# Patient Record
Sex: Male | Born: 2000 | Race: Black or African American | Hispanic: No | Marital: Single | Smoking: Never smoker
Health system: Southern US, Community
[De-identification: ages and names within clinical notes are randomized; demographics above are authoritative.]

## PROBLEM LIST (undated history)

## (undated) ENCOUNTER — Ambulatory Visit (HOSPITAL_COMMUNITY): Admission: EM | Payer: Medicaid Other

## (undated) DIAGNOSIS — J45909 Unspecified asthma, uncomplicated: Secondary | ICD-10-CM

---
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
## 2020-10-16 ENCOUNTER — Emergency Department (HOSPITAL_COMMUNITY)
Admission: EM | Admit: 2020-10-16 | Discharge: 2020-10-16 | Disposition: A | Discharge status: Home / Self Care | Payer: Medicaid Other | Attending: Emergency Medicine | Admitting: Emergency Medicine

## 2020-10-16 ENCOUNTER — Encounter (HOSPITAL_COMMUNITY): Payer: Self-pay | Admitting: Emergency Medicine

## 2020-10-16 DIAGNOSIS — R591 Generalized enlarged lymph nodes: Secondary | ICD-10-CM | POA: Insufficient documentation

## 2020-10-16 DIAGNOSIS — R109 Unspecified abdominal pain: Secondary | ICD-10-CM | POA: Diagnosis not present

## 2020-10-16 DIAGNOSIS — R590 Localized enlarged lymph nodes: Secondary | ICD-10-CM

## 2020-10-16 DIAGNOSIS — R1031 Right lower quadrant pain: Secondary | ICD-10-CM

## 2020-10-16 LAB — URINALYSIS, ROUTINE W REFLEX MICROSCOPIC
Bilirubin Urine: NEGATIVE
Glucose, UA: NEGATIVE mg/dL
Hgb urine dipstick: NEGATIVE
Ketones, ur: NEGATIVE mg/dL
Leukocytes,Ua: NEGATIVE
Nitrite: NEGATIVE
Protein, ur: NEGATIVE mg/dL
Specific Gravity, Urine: 1.027 (ref 1.005–1.030)
pH: 6 (ref 5.0–8.0)

## 2020-10-16 LAB — CBC WITH DIFFERENTIAL/PLATELET
Abs Immature Granulocytes: 0 10*3/uL (ref 0.00–0.07)
Basophils Absolute: 0 10*3/uL (ref 0.0–0.1)
Basophils Relative: 1 %
Eosinophils Absolute: 0.1 10*3/uL (ref 0.0–0.5)
Eosinophils Relative: 1 %
HCT: 43.3 % (ref 39.0–52.0)
Hemoglobin: 14.4 g/dL (ref 13.0–17.0)
Immature Granulocytes: 0 %
Lymphocytes Relative: 24 %
Lymphs Abs: 1.2 10*3/uL (ref 0.7–4.0)
MCH: 30.9 pg (ref 26.0–34.0)
MCHC: 33.3 g/dL (ref 30.0–36.0)
MCV: 92.9 fL (ref 80.0–100.0)
Monocytes Absolute: 0.6 10*3/uL (ref 0.1–1.0)
Monocytes Relative: 13 %
Neutro Abs: 3 10*3/uL (ref 1.7–7.7)
Neutrophils Relative %: 61 %
Platelets: 220 10*3/uL (ref 150–400)
RBC: 4.66 MIL/uL (ref 4.22–5.81)
RDW: 12.8 % (ref 11.5–15.5)
WBC: 4.9 10*3/uL (ref 4.0–10.5)
nRBC: 0 % (ref 0.0–0.2)

## 2020-10-16 LAB — BASIC METABOLIC PANEL
Anion gap: 9 (ref 5–15)
BUN: 8 mg/dL (ref 6–20)
CO2: 22 mmol/L (ref 22–32)
Calcium: 9.1 mg/dL (ref 8.9–10.3)
Chloride: 105 mmol/L (ref 98–111)
Creatinine, Ser: 0.78 mg/dL (ref 0.61–1.24)
GFR, Estimated: >60 mL/min (ref >60)
Glucose, Bld: 85 mg/dL (ref 70–99)
Potassium: 4.1 mmol/L (ref 3.5–5.1)
Sodium: 136 mmol/L (ref 135–145)

## 2020-10-16 NOTE — ED Triage Notes (Signed)
Pt here with reports of bulge above R groin onset approx 2 days ago. Denies pain.

## 2020-10-16 NOTE — ED Provider Notes (Signed)
[facility] EMERGENCY DEPARTMENT Provider Note   CSN: [id number] Arrival date & time: 10/16/20  1014     History No chief complaint on file.   Brian Evans is a 20 y.o. male.  He is here with complaint of some tender nodules in his right groin that been going on for 2 days.  No known trauma no infection.  No urinary symptoms.  Denies being sexually active and no prior history of STDs.  Worse with palpation or bending.  No nausea vomiting.  The history is provided by the patient.  Groin Pain This is a new problem. The current episode started 2 days ago. The problem occurs constantly. The problem has not changed since onset.Pertinent negatives include no chest pain, no abdominal pain, no headaches and no shortness of breath. The symptoms are aggravated by bending. Nothing relieves the symptoms. He has tried nothing for the symptoms. The treatment provided no relief.       No past medical history on file.  There are no problems to display for this patient.   History reviewed. No pertinent surgical history.     No family history on file.     Home Medications Prior to Admission medications   Not on File    Allergies    Patient has no known allergies.  Review of Systems   Review of Systems  Constitutional: Negative for fever.  HENT: Negative for sore throat.   Eyes: Negative for visual disturbance.  Respiratory: Negative for shortness of breath.   Cardiovascular: Negative for chest pain.  Gastrointestinal: Negative for abdominal pain, nausea and vomiting.  Genitourinary: Negative for dysuria, genital sores, hematuria, penile discharge, scrotal swelling and testicular pain.  Musculoskeletal: Negative for back pain.  Skin: Negative for rash and wound.  Neurological: Negative for headaches.    Physical Exam Updated Vital Signs BP 133/81 (BP Location: Left Arm)   Pulse 91   Temp 98.3 F (36.8 C)   Resp 16   SpO2 100%   Physical Exam Vitals  and nursing note reviewed.  Constitutional:      Appearance: Normal appearance. He is well-developed and well-nourished.  HENT:     Head: Normocephalic and atraumatic.  Eyes:     Conjunctiva/sclera: Conjunctivae normal.  Cardiovascular:     Rate and Rhythm: Normal rate and regular rhythm.     Heart sounds: No murmur heard.   Pulmonary:     Effort: Pulmonary effort is normal. No respiratory distress.     Breath sounds: Normal breath sounds.  Abdominal:     Palpations: Abdomen is soft.     Tenderness: There is no abdominal tenderness. There is no guarding or rebound.  Genitourinary:    Penis: Normal.      Testes: Normal.     Comments: No inguinal hernia appreciated.  No testicular pain or swelling.  He does have some small approximately 1 cm tender lymph nodes in his right inguinal chain.  No overlying skin findings. Musculoskeletal:        General: No edema.     Cervical back: Neck supple.  Skin:    General: Skin is warm and dry.  Neurological:     Mental Status: He is alert.  Psychiatric:        Mood and Affect: Mood and affect normal.     ED Results / Procedures / Treatments   Labs (all labs ordered are listed, but only abnormal results are displayed) Labs Reviewed  BASIC METABOLIC PANEL  CBC  WITH DIFFERENTIAL/PLATELET  URINALYSIS, ROUTINE W REFLEX MICROSCOPIC  RPR  GC/CHLAMYDIA PROBE AMP ([place]) NOT AT [facility]    EKG None  Radiology No results found.  Procedures Ultrasound ED Soft Tissue  Date/Time: 10/16/2020 12:03 PM Performed by: Terrilee Files, MD Authorized by: Terrilee Files, MD   Procedure details:    Indications: limb pain     Transverse view:  Visualized   Longitudinal view:  Visualized   Images: archived   Location:    Location: groin     Side:  Right Findings:     no abscess present    no cellulitis present    no foreign body present     Medications Ordered in ED Medications - No data to display  ED Course  I have  reviewed the triage vital signs and the nursing notes.  Pertinent labs & imaging results that were available during my care of the patient were reviewed by me and considered in my medical decision making (see chart for details).    MDM Rules/Calculators/A&P                         This patient complains of lump in right groin; this involves an extensive number of treatment Options and is a complaint that carries with it a high risk of complications and Morbidity. The differential includes hernia, lymphadenopathy, STD, lymphoma, contusion  I ordered, reviewed and interpreted labs, which included CBC with normal white count normal hemoglobin chemistries normal, urinalysis negative, GC chlamydia pending at time of discharge, RPR pending I ordered imaging studies which included bedside soft tissue ultrasound and I independently    visualized and interpreted imaging which showed no obvious abscess is identified Additional history obtained from patient's mother Previous records obtained and reviewed in epic, no recent admissions  After the interventions stated above, I reevaluated the patient and found patient to be minimally symptomatic.  Has what appears to be tender inguinal lymphadenopathy not particularly enlarged.  Recommended symptomatic treatment with ibuprofen heat.  Clear return instructions if does not resolve within a week or 2 or if enlarges.   Final Clinical Impression(s) / ED Diagnoses Final diagnoses:  Right groin pain  Lymphadenopathy, inguinal    Rx / DC Orders ED Discharge Orders    None       Terrilee Files, MD 10/16/20 1859

## 2020-10-16 NOTE — Discharge Instructions (Signed)
You were seen in the emergency department for right groin pain and some tender bumps in the groin.  These are likely lymph nodes and usually will resolve on their own.  You can use ibuprofen and apply a warm compress.  If they do not resolve in a week or 2 or if they are growing in size please return to the emergency department for reevaluation.

## 2020-10-17 LAB — RPR: RPR Ser Ql: NONREACTIVE

## 2020-10-19 LAB — GC/CHLAMYDIA PROBE AMP (~~LOC~~) NOT AT ARMC
Chlamydia: NEGATIVE
Comment: NEGATIVE
Comment: NORMAL
Neisseria Gonorrhea: NEGATIVE

## 2022-02-20 ENCOUNTER — Other Ambulatory Visit: Payer: Self-pay

## 2022-02-20 ENCOUNTER — Encounter (HOSPITAL_COMMUNITY): Payer: Self-pay | Admitting: Emergency Medicine

## 2022-02-20 ENCOUNTER — Emergency Department (HOSPITAL_COMMUNITY)
Admission: EM | Admit: 2022-02-20 | Discharge: 2022-02-20 | Discharge status: Against Medical Advice | Payer: Medicaid Other | Attending: Emergency Medicine | Admitting: Emergency Medicine

## 2022-02-20 DIAGNOSIS — Z113 Encounter for screening for infections with a predominantly sexual mode of transmission: Secondary | ICD-10-CM | POA: Insufficient documentation

## 2022-02-20 DIAGNOSIS — R300 Dysuria: Secondary | ICD-10-CM | POA: Diagnosis not present

## 2022-02-20 DIAGNOSIS — Z5321 Procedure and treatment not carried out due to patient leaving prior to being seen by health care provider: Secondary | ICD-10-CM | POA: Insufficient documentation

## 2022-02-20 LAB — URINALYSIS, ROUTINE W REFLEX MICROSCOPIC
Bilirubin Urine: NEGATIVE
Glucose, UA: NEGATIVE mg/dL
Hgb urine dipstick: NEGATIVE
Ketones, ur: NEGATIVE mg/dL
Leukocytes,Ua: NEGATIVE
Nitrite: NEGATIVE
Protein, ur: NEGATIVE mg/dL
Specific Gravity, Urine: 1.024 (ref 1.005–1.030)
pH: 5 (ref 5.0–8.0)

## 2022-02-20 NOTE — ED Notes (Signed)
PT leaving at this time 

## 2022-02-20 NOTE — ED Triage Notes (Signed)
Patient requesting STD screening reports dysuria onset last week , denies penile discharge.

## 2022-03-14 ENCOUNTER — Emergency Department (HOSPITAL_COMMUNITY): Payer: Medicaid Other

## 2022-03-14 ENCOUNTER — Encounter (HOSPITAL_COMMUNITY): Payer: Self-pay

## 2022-03-14 ENCOUNTER — Emergency Department (HOSPITAL_COMMUNITY)
Admission: EM | Admit: 2022-03-14 | Discharge: 2022-03-14 | Disposition: A | Discharge status: Home / Self Care | Payer: Medicaid Other | Attending: Emergency Medicine | Admitting: Emergency Medicine

## 2022-03-14 DIAGNOSIS — R000 Tachycardia, unspecified: Secondary | ICD-10-CM | POA: Diagnosis not present

## 2022-03-14 DIAGNOSIS — S31819A Unspecified open wound of right buttock, initial encounter: Secondary | ICD-10-CM | POA: Diagnosis not present

## 2022-03-14 DIAGNOSIS — Z23 Encounter for immunization: Secondary | ICD-10-CM | POA: Insufficient documentation

## 2022-03-14 DIAGNOSIS — S31829A Unspecified open wound of left buttock, initial encounter: Secondary | ICD-10-CM | POA: Insufficient documentation

## 2022-03-14 DIAGNOSIS — W3400XA Accidental discharge from unspecified firearms or gun, initial encounter: Secondary | ICD-10-CM | POA: Diagnosis not present

## 2022-03-14 DIAGNOSIS — S31803A Puncture wound without foreign body of unspecified buttock, initial encounter: Secondary | ICD-10-CM

## 2022-03-14 LAB — URINALYSIS, ROUTINE W REFLEX MICROSCOPIC
Bilirubin Urine: NEGATIVE
Glucose, UA: NEGATIVE mg/dL
Hgb urine dipstick: NEGATIVE
Ketones, ur: NEGATIVE mg/dL
Leukocytes,Ua: NEGATIVE
Nitrite: NEGATIVE
Protein, ur: NEGATIVE mg/dL
Specific Gravity, Urine: 1.028 (ref 1.005–1.030)
pH: 7 (ref 5.0–8.0)

## 2022-03-14 LAB — I-STAT CHEM 8, ED
BUN: 4 mg/dL — ABNORMAL LOW (ref 6–20)
Calcium, Ion: 0.98 mmol/L — ABNORMAL LOW (ref 1.15–1.40)
Chloride: 101 mmol/L (ref 98–111)
Creatinine, Ser: 0.9 mg/dL (ref 0.61–1.24)
Glucose, Bld: 102 mg/dL — ABNORMAL HIGH (ref 70–99)
HCT: 49 % (ref 39.0–52.0)
Hemoglobin: 16.7 g/dL (ref 13.0–17.0)
Potassium: 3 mmol/L — ABNORMAL LOW (ref 3.5–5.1)
Sodium: 139 mmol/L (ref 135–145)
TCO2: 23 mmol/L (ref 22–32)

## 2022-03-14 LAB — CBC
HCT: 46 % (ref 39.0–52.0)
Hemoglobin: 15.9 g/dL (ref 13.0–17.0)
MCH: 32.1 pg (ref 26.0–34.0)
MCHC: 34.6 g/dL (ref 30.0–36.0)
MCV: 92.7 fL (ref 80.0–100.0)
Platelets: 264 10*3/uL (ref 150–400)
RBC: 4.96 MIL/uL (ref 4.22–5.81)
RDW: 12.7 % (ref 11.5–15.5)
WBC: 7.3 10*3/uL (ref 4.0–10.5)
nRBC: 0 % (ref 0.0–0.2)

## 2022-03-14 LAB — COMPREHENSIVE METABOLIC PANEL
ALT: 10 U/L (ref 0–44)
AST: 20 U/L (ref 15–41)
Albumin: 3.6 g/dL (ref 3.5–5.0)
Alkaline Phosphatase: 72 U/L (ref 38–126)
Anion gap: 8 (ref 5–15)
BUN: 5 mg/dL — ABNORMAL LOW (ref 6–20)
CO2: 24 mmol/L (ref 22–32)
Calcium: 8.5 mg/dL — ABNORMAL LOW (ref 8.9–10.3)
Chloride: 105 mmol/L (ref 98–111)
Creatinine, Ser: 1.11 mg/dL (ref 0.61–1.24)
GFR, Estimated: >60 mL/min (ref >60)
Glucose, Bld: 109 mg/dL — ABNORMAL HIGH (ref 70–99)
Potassium: 3.3 mmol/L — ABNORMAL LOW (ref 3.5–5.1)
Sodium: 137 mmol/L (ref 135–145)
Total Bilirubin: 0.7 mg/dL (ref 0.3–1.2)
Total Protein: 5.7 g/dL — ABNORMAL LOW (ref 6.5–8.1)

## 2022-03-14 LAB — PROTIME-INR
INR: 1.1 (ref 0.8–1.2)
Prothrombin Time: 13.7 seconds (ref 11.4–15.2)

## 2022-03-14 LAB — LACTIC ACID, PLASMA
Lactic Acid, Venous: 4 mmol/L (ref 0.5–1.9)
Lactic Acid, Venous: 1 mmol/L (ref 0.5–1.9)

## 2022-03-14 LAB — ETHANOL: Alcohol, Ethyl (B): <10 mg/dL (ref <10)

## 2022-03-14 MED ORDER — FENTANYL CITRATE PF 50 MCG/ML IJ SOSY
50.0000 ug | PREFILLED_SYRINGE | Freq: Once | INTRAMUSCULAR | Status: AC
  Administered 2022-03-14: 50 ug via INTRAVENOUS
  Filled 2022-03-14: qty 1

## 2022-03-14 MED ORDER — TETANUS-DIPHTH-ACELL PERTUSSIS 5-2.5-18.5 LF-MCG/0.5 IM SUSY
0.5000 mL | PREFILLED_SYRINGE | Freq: Once | INTRAMUSCULAR | Status: AC
  Administered 2022-03-14: 0.5 mL via INTRAMUSCULAR
  Filled 2022-03-14: qty 0.5

## 2022-03-14 MED ORDER — IOHEXOL 300 MG/ML  SOLN
100.0000 mL | Freq: Once | INTRAMUSCULAR | Status: AC | PRN
  Administered 2022-03-14: 100 mL via INTRAVENOUS

## 2022-03-14 MED ORDER — ONDANSETRON HCL 4 MG/2ML IJ SOLN
4.0000 mg | Freq: Once | INTRAMUSCULAR | Status: AC
  Administered 2022-03-14: 4 mg via INTRAVENOUS
  Filled 2022-03-14: qty 2

## 2022-03-14 MED ORDER — OXYCODONE-ACETAMINOPHEN 5-325 MG PO TABS: 1.0000 | ORAL_TABLET | Freq: Four times a day (QID) | ORAL | 0 refills | Status: DC | PRN

## 2022-03-14 MED ORDER — OXYCODONE-ACETAMINOPHEN 5-325 MG PO TABS
1.0000 | ORAL_TABLET | Freq: Once | ORAL | Status: AC
  Administered 2022-03-14: 1 via ORAL
  Filled 2022-03-14: qty 1

## 2022-03-14 MED ORDER — LACTATED RINGERS IV BOLUS
1000.0000 mL | Freq: Once | INTRAVENOUS | Status: AC
  Administered 2022-03-14: 1000 mL via INTRAVENOUS

## 2022-03-14 MED ORDER — KETOROLAC TROMETHAMINE 15 MG/ML IJ SOLN
15.0000 mg | Freq: Once | INTRAMUSCULAR | Status: AC
  Administered 2022-03-14: 15 mg via INTRAVENOUS
  Filled 2022-03-14: qty 1
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
## 2022-03-14 NOTE — ED Provider Notes (Signed)
7:25 PM Repeat lactic normal.  Patient awake, alert, standing upright, speaking on cellular telephone with no difficulty.   Gerhard Munch, MD 03/14/22 1925
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
## 2022-03-14 NOTE — ED Notes (Signed)
Pt left without receiving discharge instructions. Stated his ride was here. Ambulatory without difficulty.
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
## 2022-03-20 ENCOUNTER — Other Ambulatory Visit: Payer: Self-pay

## 2022-03-20 ENCOUNTER — Ambulatory Visit (HOSPITAL_COMMUNITY)
Admission: EM | Admit: 2022-03-20 | Discharge: 2022-03-20 | Disposition: A | Discharge status: Home / Self Care | Payer: Medicaid Other | Attending: Internal Medicine | Admitting: Internal Medicine

## 2022-03-20 ENCOUNTER — Encounter (HOSPITAL_COMMUNITY): Payer: Self-pay | Admitting: *Deleted

## 2022-03-20 DIAGNOSIS — W3400XD Accidental discharge from unspecified firearms or gun, subsequent encounter: Secondary | ICD-10-CM | POA: Diagnosis not present

## 2022-03-20 DIAGNOSIS — L089 Local infection of the skin and subcutaneous tissue, unspecified: Secondary | ICD-10-CM

## 2022-03-20 MED ORDER — IBUPROFEN 800 MG PO TABS: 800.0000 mg | ORAL_TABLET | Freq: Three times a day (TID) | ORAL | 0 refills | Status: DC

## 2022-03-20 MED ORDER — KETOROLAC TROMETHAMINE 30 MG/ML IJ SOLN
INTRAMUSCULAR | Status: AC
  Filled 2022-03-20: qty 1

## 2022-03-20 MED ORDER — KETOROLAC TROMETHAMINE 30 MG/ML IJ SOLN
30.0000 mg | Freq: Once | INTRAMUSCULAR | Status: AC
  Administered 2022-03-20: 30 mg via INTRAMUSCULAR

## 2022-03-20 MED ORDER — CEPHALEXIN 500 MG PO CAPS: 500.0000 mg | ORAL_CAPSULE | Freq: Three times a day (TID) | ORAL | 0 refills | Status: DC
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
## 2022-03-20 NOTE — ED Triage Notes (Addendum)
2
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
## 2022-03-20 NOTE — ED Notes (Signed)
INfo placed on wrong PT

## 2022-11-01 ENCOUNTER — Encounter (HOSPITAL_COMMUNITY): Payer: Self-pay | Admitting: Emergency Medicine

## 2022-11-01 ENCOUNTER — Emergency Department (HOSPITAL_COMMUNITY): Payer: Medicaid Other

## 2022-11-01 ENCOUNTER — Emergency Department (HOSPITAL_COMMUNITY)
Admission: EM | Admit: 2022-11-01 | Discharge: 2022-11-01 | Disposition: A | Discharge status: Home / Self Care | Payer: Medicaid Other | Attending: Emergency Medicine | Admitting: Emergency Medicine

## 2022-11-01 ENCOUNTER — Other Ambulatory Visit: Payer: Self-pay

## 2022-11-01 DIAGNOSIS — Y92410 Unspecified street and highway as the place of occurrence of the external cause: Secondary | ICD-10-CM | POA: Diagnosis not present

## 2022-11-01 DIAGNOSIS — M545 Low back pain, unspecified: Secondary | ICD-10-CM | POA: Insufficient documentation

## 2022-11-01 DIAGNOSIS — T148XXA Other injury of unspecified body region, initial encounter: Secondary | ICD-10-CM | POA: Insufficient documentation

## 2022-11-01 DIAGNOSIS — T07XXXA Unspecified multiple injuries, initial encounter: Secondary | ICD-10-CM

## 2022-11-01 DIAGNOSIS — M549 Dorsalgia, unspecified: Secondary | ICD-10-CM | POA: Diagnosis present

## 2022-11-01 HISTORY — DX: Unspecified asthma, uncomplicated: J45.909

## 2022-11-01 MED ORDER — NAPROXEN 500 MG PO TABS: 500.0000 mg | ORAL_TABLET | Freq: Two times a day (BID) | ORAL | 0 refills | Status: DC
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
## 2022-11-01 NOTE — Discharge Instructions (Signed)
Your CT scan is normal - no signs of bleeding or fractures Concussions may cause 2 weeks of headache, vomiting or other symptoms  Please take Naprosyn, 500mg  by mouth twice daily as needed for pain - this in an antiinflammatory medicine (NSAID) and is similar to ibuprofen - many people feel that it is stronger than ibuprofen and it is easier to take since it is a smaller pill.  Please use this only for 1 week - if your pain persists, you will need to follow up with your doctor in the office for ongoing guidance and pain control.  Please take Naprosyn, 500mg  by mouth twice daily as needed for pain - this in an antiinflammatory medicine (NSAID) and is similar to ibuprofen - many people feel that it is stronger than ibuprofen and it is easier to take since it is a smaller pill.  Please use this only for 1 week - if your pain persists, you will need to follow up with your doctor in the office for ongoing guidance and pain control.  Thank you for allowing [place] to treat you in the emergency department today.  After reviewing your examination and potential testing that was done it appears that you are safe to go home.  I would like for you to follow-up with your doctor within the next several days, have them obtain your results and follow-up with them to review all of these tests.  If you should develop severe or worsening symptoms return to the emergency department immediately
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
## 2023-01-20 ENCOUNTER — Ambulatory Visit (HOSPITAL_COMMUNITY)
Admission: EM | Admit: 2023-01-20 | Discharge: 2023-01-20 | Discharge status: Against Medical Advice | Payer: Medicaid Other

## 2023-01-20 NOTE — ED Notes (Signed)
Called patient x 2 days.

## 2023-01-22 ENCOUNTER — Encounter (HOSPITAL_COMMUNITY): Payer: Self-pay | Admitting: Emergency Medicine

## 2023-01-22 ENCOUNTER — Ambulatory Visit (HOSPITAL_COMMUNITY)
Admission: EM | Admit: 2023-01-22 | Discharge: 2023-01-22 | Disposition: A | Discharge status: Home / Self Care | Payer: Medicaid Other | Attending: Emergency Medicine | Admitting: Emergency Medicine

## 2023-01-22 DIAGNOSIS — Z113 Encounter for screening for infections with a predominantly sexual mode of transmission: Secondary | ICD-10-CM | POA: Diagnosis present

## 2023-01-22 LAB — HIV ANTIBODY (ROUTINE TESTING W REFLEX): HIV Screen 4th Generation wRfx: NONREACTIVE

## 2023-01-22 LAB — RPR: RPR Ser Ql: NONREACTIVE

## 2023-01-22 NOTE — Discharge Instructions (Addendum)
We have screened you for sexually transmitted infections today.  The results will take a few days, but we will contact you if anything results is abnormal.  Results will be available positive or negative on your MyChart.  Abstain from intercourse until all results are received, if you test positive, please notify any sexual partners.  Please follow-up with a primary care provider or return to clinic for any new or concerning symptoms.

## 2023-01-22 NOTE — ED Triage Notes (Signed)
Pt presents to UC for routine STD testing. Denies any symptoms at this time. 

## 2023-01-22 NOTE — ED Provider Notes (Signed)
[facility]    CSN: [id number] Arrival date & time: 01/22/23  1248      History   Chief Complaint Chief Complaint  Patient presents with   SEXUALLY TRANSMITTED DISEASE    HPI Brian Evans is a 22 y.o. male.   Patient presents to clinic for sexually-transmitted infection screening.  He denies any symptoms.  No dysuria, penile lesions or penile discharge.  Denies any exposures to STIs.  Would like HIV and syphilis screening.  The history is provided by the patient and medical records.    Past Medical History:  Diagnosis Date   Asthma     There are no problems to display for this patient.   History reviewed. No pertinent surgical history.     Home Medications    Prior to Admission medications   Medication Sig Start Date End Date Taking? Authorizing Provider  cephALEXin (KEFLEX) 500 MG capsule Take 1 capsule (500 mg total) by mouth 3 (three) times daily. 03/20/22   Carlisle Beers, FNP  ibuprofen (ADVIL) 800 MG tablet Take 1 tablet (800 mg total) by mouth 3 (three) times daily. 03/20/22   Carlisle Beers, FNP  naproxen (NAPROSYN) 500 MG tablet Take 1 tablet (500 mg total) by mouth 2 (two) times daily with a meal. 11/01/22   Eber Hong, MD  oxyCODONE-acetaminophen (PERCOCET/ROXICET) 5-325 MG tablet Take 1 tablet by mouth every 6 (six) hours as needed for severe pain. 03/14/22   Gwyneth Sprout, MD    Family History History reviewed. No pertinent family history.  Social History Social History   Tobacco Use   Smoking status: Never   Smokeless tobacco: Never  Substance Use Topics   Alcohol use: Never   Drug use: Never     Allergies   Patient has no known allergies.   Review of Systems Review of Systems  Genitourinary:  Negative for dysuria, flank pain, genital sores, penile discharge, penile pain, penile swelling, scrotal swelling and testicular pain.     Physical Exam Triage Vital Signs ED Triage Vitals [01/22/23 1301]  Enc  Vitals Group     BP (!) 142/82     Pulse Rate 77     Resp 18     Temp 98.1 F (36.7 C)     Temp Source Oral     SpO2 99 %     Weight      Height      Head Circumference      Peak Flow      Pain Score 0     Pain Loc      Pain Edu?      Excl. in GC?    No data found.  Updated Vital Signs BP (!) 142/82 (BP Location: Left Arm)   Pulse 77   Temp 98.1 F (36.7 C) (Oral)   Resp 18   SpO2 99%   Visual Acuity Right Eye Distance:   Left Eye Distance:   Bilateral Distance:    Right Eye Near:   Left Eye Near:    Bilateral Near:     Physical Exam Vitals and nursing note reviewed.  Constitutional:      Appearance: Normal appearance.  HENT:     Head: Normocephalic and atraumatic.     Right Ear: External ear normal.     Left Ear: External ear normal.     Nose: Nose normal.     Mouth/Throat:     Mouth: Mucous membranes are moist.  Eyes:  Conjunctiva/sclera: Conjunctivae normal.  Cardiovascular:     Rate and Rhythm: Normal rate.  Pulmonary:     Effort: Pulmonary effort is normal. No respiratory distress.  Musculoskeletal:        General: No swelling. Normal range of motion.  Skin:    General: Skin is warm and dry.  Neurological:     General: No focal deficit present.     Mental Status: He is alert.  Psychiatric:        Mood and Affect: Mood normal.      UC Treatments / Results  Labs (all labs ordered are listed, but only abnormal results are displayed) Labs Reviewed  RPR  HIV ANTIBODY (ROUTINE TESTING W REFLEX)  CYTOLOGY, (ORAL, ANAL, URETHRAL) ANCILLARY ONLY    EKG   Radiology No results found.  Procedures Procedures (including critical care time)  Medications Ordered in UC Medications - No data to display  Initial Impression / Assessment and Plan / UC Course  I have reviewed the triage vital signs and the nursing notes.  Pertinent labs & imaging results that were available during my care of the patient were reviewed by me and considered in  my medical decision making (see chart for details).  Vitals and triage reviewed, patient is hemodynamically stable.  Presents to clinic for ' routine STI screening.'  Asymptomatic, no known exposures.  Cytology blood work obtained.  Staff will contact patient if anything results is positive to initiate the appropriate treatment.  Plan of care, follow-up care and return precautions given, no questions at this time.     Final Clinical Impressions(s) / UC Diagnoses   Final diagnoses:  Screening examination for sexually transmitted disease     Discharge Instructions      We have screened you for sexually transmitted infections today.  The results will take a few days, but we will contact you if anything results is abnormal.  Results will be available positive or negative on your MyChart.  Abstain from intercourse until all results are received, if you test positive, please notify any sexual partners.  Please follow-up with a primary care provider or return to clinic for any new or concerning symptoms.      ED Prescriptions   None    PDMP not reviewed this encounter.   Tayvian Holycross, [place], [place] 01/22/23 1309

## 2023-01-23 LAB — CYTOLOGY, (ORAL, ANAL, URETHRAL) ANCILLARY ONLY
Chlamydia: NEGATIVE
Comment: NEGATIVE
Comment: NEGATIVE
Comment: NORMAL
Neisseria Gonorrhea: NEGATIVE
Trichomonas: NEGATIVE

## 2024-04-28 ENCOUNTER — Ambulatory Visit (HOSPITAL_COMMUNITY)
Admission: EM | Admit: 2024-04-28 | Discharge: 2024-04-28 | Disposition: A | Discharge status: Home / Self Care | Attending: Emergency Medicine | Admitting: Emergency Medicine

## 2024-04-28 ENCOUNTER — Encounter (HOSPITAL_COMMUNITY): Payer: Self-pay

## 2024-04-28 DIAGNOSIS — Z113 Encounter for screening for infections with a predominantly sexual mode of transmission: Secondary | ICD-10-CM | POA: Insufficient documentation

## 2024-04-28 LAB — HIV ANTIBODY (ROUTINE TESTING W REFLEX): HIV Screen 4th Generation wRfx: NONREACTIVE

## 2024-04-28 NOTE — ED Provider Notes (Signed)
 [facility]    CSN: [id number] Arrival date & time: 04/28/24  1256      History   Chief Complaint Chief Complaint  Patient presents with   SEXUALLY TRANSMITTED DISEASE    HPI Brian Evans is a 23 y.o. male.   Patient presents to clinic over concern for sexually-transmitted infection screening.  Denies any recent exposures.  Denies symptoms such as penile discharge, dysuria, urgency, frequency, genital sores, genital lesions or testicular pain.  Would like HIV and syphilis screening.  Recent unprotected intercourse yesterday.  The history is provided by the patient and medical records.    Past Medical History:  Diagnosis Date   Asthma     There are no active problems to display for this patient.   History reviewed. No pertinent surgical history.     Home Medications    Prior to Admission medications   Not on File    Family History History reviewed. No pertinent family history.  Social History Social History   Tobacco Use   Smoking status: Never   Smokeless tobacco: Never  Substance Use Topics   Alcohol use: Never   Drug use: Never     Allergies   Patient has no known allergies.   Review of Systems Review of Systems  Per HPI  Physical Exam Triage Vital Signs ED Triage Vitals [04/28/24 1349]  Encounter Vitals Group     BP 115/77     Girls Systolic BP Percentile      Girls Diastolic BP Percentile      Boys Systolic BP Percentile      Boys Diastolic BP Percentile      Pulse Rate 79     Resp 16     Temp 97.9 F (36.6 C)     Temp Source Oral     SpO2 98 %     Weight      Height      Head Circumference      Peak Flow      Pain Score 0     Pain Loc      Pain Education      Exclude from Growth Chart    No data found.  Updated Vital Signs BP 115/77 (BP Location: Right Arm)   Pulse 79   Temp 97.9 F (36.6 C) (Oral)   Resp 16   SpO2 98%   Visual Acuity Right Eye Distance:   Left Eye Distance:   Bilateral  Distance:    Right Eye Near:   Left Eye Near:    Bilateral Near:     Physical Exam Vitals and nursing note reviewed.  Constitutional:      Appearance: Normal appearance.  HENT:     Head: Normocephalic and atraumatic.     Right Ear: External ear normal.     Left Ear: External ear normal.     Nose: Nose normal.     Mouth/Throat:     Mouth: Mucous membranes are moist.  Eyes:     Conjunctiva/sclera: Conjunctivae normal.  Cardiovascular:     Rate and Rhythm: Normal rate.  Pulmonary:     Effort: Pulmonary effort is normal. No respiratory distress.  Neurological:     General: No focal deficit present.     Mental Status: He is alert and oriented to person, place, and time.  Psychiatric:        Mood and Affect: Mood normal.        Behavior: Behavior normal.  UC Treatments / Results  Labs (all labs ordered are listed, but only abnormal results are displayed) Labs Reviewed  RPR  HIV ANTIBODY (ROUTINE TESTING W REFLEX)  CYTOLOGY, (ORAL, ANAL, URETHRAL) ANCILLARY ONLY    EKG   Radiology No results found.  Procedures Procedures (including critical care time)  Medications Ordered in UC Medications - No data to display  Initial Impression / Assessment and Plan / UC Course  I have reviewed the triage vital signs and the nursing notes.  Pertinent labs & imaging results that were available during my care of the patient were reviewed by me and considered in my medical decision making (see chart for details).  Vitals and triage reviewed, patient is hemodynamically stable.  Asymptomatic STI screening.  Testing obtained, staff will contact if results require treatment.  Did have recent unprotected intercourse, encouraged to return in the next 2 to 3 weeks for repeat testing if desired.  Plan of care, follow-up care return precautions given, no questions at this time.    Final Clinical Impressions(s) / UC Diagnoses   Final diagnoses:  Encounter for screening  examination for sexually transmitted infection     Discharge Instructions      Today you have been screened for sexually transmitted infections.  Results to be available over the next few days and staff will contact you if treatment is needed based on results.  Abstain from intercourse until all results have been received.  If you are concerned about exposure from your recent unprotected intercourse, yesterday, please return to clinic in the next 2 or 3 weeks for repeat testing.     ED Prescriptions   None    PDMP not reviewed this encounter.   Dreama Denese SAILOR, FNP 04/28/24 1433

## 2024-04-28 NOTE — ED Triage Notes (Signed)
 Patient here today to be tested for all Stds. Denies symptoms.

## 2024-04-28 NOTE — Discharge Instructions (Signed)
 Today you have been screened for sexually transmitted infections.  Results to be available over the next few days and staff will contact you if treatment is needed based on results.  Abstain from intercourse until all results have been received.  If you are concerned about exposure from your recent unprotected intercourse, yesterday, please return to clinic in the next 2 or 3 weeks for repeat testing.

## 2024-04-29 LAB — RPR: RPR Ser Ql: NONREACTIVE

## 2024-04-30 LAB — CYTOLOGY, (ORAL, ANAL, URETHRAL) ANCILLARY ONLY
Chlamydia: NEGATIVE
Comment: NEGATIVE
Comment: NEGATIVE
Comment: NORMAL
Neisseria Gonorrhea: NEGATIVE
Trichomonas: NEGATIVE
